# Patient Record
Sex: Female | Born: 2005 | Race: White | Hispanic: Yes | Marital: Single | Smoking: Never smoker
Health system: Southern US, Community
[De-identification: ages and names within clinical notes are randomized; demographics above are authoritative.]

---
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
## 2023-04-28 ENCOUNTER — Other Ambulatory Visit (LOCAL_COMMUNITY_HEALTH_CENTER): Payer: Self-pay

## 2023-04-28 DIAGNOSIS — Z201 Contact with and (suspected) exposure to tuberculosis: Secondary | ICD-10-CM

## 2023-04-28 LAB — HM HIV SCREENING LAB: HM HIV Screening: NEGATIVE
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
## 2023-05-02 LAB — QUANTIFERON-TB GOLD PLUS
QuantiFERON Mitogen Value: >10 [IU]/mL
QuantiFERON Nil Value: 0 [IU]/mL
QuantiFERON TB1 Ag Value: 0.03 [IU]/mL
QuantiFERON TB2 Ag Value: 0.05 [IU]/mL
QuantiFERON-TB Gold Plus: NEGATIVE

## 2023-05-05 ENCOUNTER — Ambulatory Visit (LOCAL_COMMUNITY_HEALTH_CENTER): Payer: Medicaid Other

## 2023-05-05 VITALS — Wt 209.0 lb

## 2023-05-05 DIAGNOSIS — Z111 Encounter for screening for respiratory tuberculosis: Secondary | ICD-10-CM

## 2023-05-05 NOTE — Progress Notes (Signed)
TC to patient today re: negative QFT results and pending HIV result. Completed EPI today even thought results are negative. Informed patient that we would possibly repeat QFT in 8 weeks depending on family members culture results.Richmond Campbell, RN   EPI-05/05/23 QFT negative 04/28/23 HIV pending 04/28/23

## 2023-08-17 ENCOUNTER — Other Ambulatory Visit: Payer: Medicaid Other

## 2023-08-17 DIAGNOSIS — Z111 Encounter for screening for respiratory tuberculosis: Secondary | ICD-10-CM
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
## 2023-08-20 ENCOUNTER — Telehealth: Payer: Self-pay

## 2023-08-20 DIAGNOSIS — Z201 Contact with and (suspected) exposure to tuberculosis: Secondary | ICD-10-CM

## 2023-08-20 DIAGNOSIS — R7612 Nonspecific reaction to cell mediated immunity measurement of gamma interferon antigen response without active tuberculosis: Secondary | ICD-10-CM

## 2023-08-20 LAB — QUANTIFERON-TB GOLD PLUS
QuantiFERON Mitogen Value: >10 [IU]/mL
QuantiFERON Nil Value: 0.1 [IU]/mL
QuantiFERON TB1 Ag Value: 2.65 [IU]/mL
QuantiFERON TB2 Ag Value: 2.46 [IU]/mL
QuantiFERON-TB Gold Plus: POSITIVE — AB

## 2023-08-20 NOTE — Progress Notes (Signed)
TC to patient. Informed positive QFT. Patient tearful and scared. Reports chest pressure and fatigue and she's unsure if it's stress and work or sickness. Works for [employer] and is super busy right now with the holidays.  Updated EPI from 05/05/23; no changes, no BCM, not sexually active, no OTC or Rx medications. Patient will try to go for CXR next week. Verbalized understanding to call TB RN if she has any questions once she absorbs this news. Stressed that we don't feel she has active TB and she does have to  isolate  like her grandfather was. CXR ordered Richmond Campbell, RN

## 2023-08-21 ENCOUNTER — Ambulatory Visit
Admission: RE | Admit: 2023-08-21 | Discharge: 2023-08-21 | Disposition: A | Discharge status: Home / Self Care | Payer: Medicaid Other | Source: Ambulatory Visit | Attending: Surgery | Admitting: Surgery

## 2023-08-21 DIAGNOSIS — Z201 Contact with and (suspected) exposure to tuberculosis: Secondary | ICD-10-CM | POA: Insufficient documentation

## 2023-08-21 DIAGNOSIS — R7612 Nonspecific reaction to cell mediated immunity measurement of gamma interferon antigen response without active tuberculosis: Secondary | ICD-10-CM | POA: Diagnosis present

## 2023-08-24 ENCOUNTER — Other Ambulatory Visit: Payer: Self-pay | Admitting: Surgery

## 2023-08-24 DIAGNOSIS — Z227 Latent tuberculosis: Secondary | ICD-10-CM | POA: Insufficient documentation

## 2023-08-24 DIAGNOSIS — R7612 Nonspecific reaction to cell mediated immunity measurement of gamma interferon antigen response without active tuberculosis: Secondary | ICD-10-CM

## 2023-08-24 NOTE — Progress Notes (Signed)
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   Diagnosis of LTBI and pertinent labs/info: +QFT: 08/17/2023 CXR: negative 08/21/23 EPI: 05/05/23 and updated on 08/24/23  HIV: neg 04/28/23  Therapy: Rifabutin 300mg  daily for 4 months Planned LTBI therapy start date: 08/28/2023  PCP: n/a   Tuberculosis treatment orders  All patients are to be monitored per [place] and county TB policies.   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 Treat for latent TB per the following:  Rifabutin 300mg  daily by mouth x 4 months per Dr. Wyvonnia Lora.   No monthly or baseline labs currently indicated. Only needs labs if concerning symptoms arise such as persistent rash, itching, nosebleeds, easy bruising, nausea, vomiting, dark colored urine, the patient starts new potentially hepatotoxic medications, or significant alcohol consumption is reported that would require monitoring.

## 2023-08-28 ENCOUNTER — Ambulatory Visit: Payer: Medicaid Other

## 2023-08-28 VITALS — Wt 215.0 lb

## 2023-08-28 DIAGNOSIS — Z227 Latent tuberculosis: Secondary | ICD-10-CM | POA: Diagnosis not present

## 2023-08-28 DIAGNOSIS — R7612 Nonspecific reaction to cell mediated immunity measurement of gamma interferon antigen response without active tuberculosis: Secondary | ICD-10-CM | POA: Insufficient documentation

## 2023-08-28 MED ORDER — RIFAMPIN 300 MG PO CAPS: 600.0000 mg | ORAL_CAPSULE | Freq: Every day | ORAL | Status: AC

## 2023-08-28 NOTE — Progress Notes (Signed)
Patient in clinic today to start LTBI tx. Originally patient was ordered Rifabutin x 4 months but [facility] received a shipment of Rifampin this week. Rifampin 300mg  #60 dispensed per Dr. Wyvonnia Lora standing order. TB RN will follow up with patient to schedule next appt.  Richmond Campbell, RN

## 2023-08-28 NOTE — Progress Notes (Signed)
Due to [facility] receiving a shipment of Rifampin 300mg , patient will be treated for LTBI per Dr. Irena Cords standing order.  Rifampin 300mg  #60; 2 capsules daily x 4 months.Richmond Campbell, RN

## 2023-09-25 ENCOUNTER — Ambulatory Visit: Payer: Medicaid Other

## 2023-09-25 VITALS — Wt 209.0 lb

## 2023-09-25 DIAGNOSIS — Z227 Latent tuberculosis: Secondary | ICD-10-CM

## 2023-09-25 MED ORDER — RIFAMPIN 300 MG PO CAPS: 600.0000 mg | ORAL_CAPSULE | Freq: Every day | ORAL | Status: AC

## 2023-09-25 NOTE — Progress Notes (Signed)
Patient in clinic for Rifampin #2 visit. No complaints and is doing well on antibiotic. Rifampin 300mg  #60 dispensed per Dr. Wyvonnia Lora SO Richmond Campbell, RN

## 2023-10-26 ENCOUNTER — Ambulatory Visit (LOCAL_COMMUNITY_HEALTH_CENTER): Payer: Medicaid Other

## 2023-10-26 VITALS — Wt 209.0 lb

## 2023-10-26 DIAGNOSIS — Z227 Latent tuberculosis: Secondary | ICD-10-CM

## 2023-10-26 MED ORDER — RIFAMPIN 300 MG PO CAPS: 600.0000 mg | ORAL_CAPSULE | Freq: Every day | ORAL | Status: AC

## 2023-10-26 NOTE — Progress Notes (Signed)
 Patient in clinic for #3 Rifampin/LTBI tx. Denies any concerns or problems with treatment thus far. Dispensed Rifampin 300mg  #60 per Wyvonnia Lora SO. Patient understands that she has one more month of meds after completing this bottle of Rifampin Richmond Campbell, RN

## 2023-11-26 ENCOUNTER — Telehealth: Payer: Self-pay

## 2023-11-26 NOTE — Telephone Encounter (Signed)
 Patient needs Rifampin # 4. Phone call to patient, appointment scheduled 11/30/2023 at 2:15 pm. Stressed importance of completing treatment as recommended, states understanding and agrees with plan. Lavinia Sharps, RN

## 2023-11-30 ENCOUNTER — Ambulatory Visit

## 2023-11-30 ENCOUNTER — Other Ambulatory Visit: Payer: Self-pay

## 2023-11-30 VITALS — Wt 201.0 lb

## 2023-11-30 DIAGNOSIS — Z227 Latent tuberculosis: Secondary | ICD-10-CM | POA: Diagnosis not present

## 2023-11-30 MED ORDER — RIFAMPIN 300 MG PO CAPS: 600.0000 mg | ORAL_CAPSULE | Freq: Every day | ORAL | Status: AC

## 2023-11-30 NOTE — Progress Notes (Signed)
 Patient has been taking Rifampin 600 mg daily for 3 months for LTBI treatment.  Patient is doing well on current medication regimen.   TB flowsheet complete.   Dispensed #4 and final month of Rifampin for LTBI tx. Dispensed #60, 300 mg capsules.   Explained to patient that will always be positive on PPD skin test and blood test for exposure to TB. If patient is asked by employer, school, or other institution to take a TB test, patient should supply proof of treatment completion and/or obtain a TB Screening at the health department or at patient's PCP. This information was also provided in writing in the patient's completion letter which was given to the patient today along with a TB treatment completion card.   All completion letters and completion card were sent for scanning into Epic, along with the patient's TB drug record ([id number]).  Patient was advised to contact [facility]/TB control phone for any concerning symptoms or questions.    Patient states understanding and agrees with plan. Lavinia Sharps, RN

## 2024-02-04 ENCOUNTER — Ambulatory Visit (INDEPENDENT_AMBULATORY_CARE_PROVIDER_SITE_OTHER)

## 2024-02-04 ENCOUNTER — Ambulatory Visit
Admission: RE | Admit: 2024-02-04 | Discharge: 2024-02-04 | Disposition: A | Discharge status: Home / Self Care | Payer: Self-pay | Source: Ambulatory Visit | Attending: Family Medicine | Admitting: Family Medicine

## 2024-02-04 VITALS — BP 130/87 | HR 84 | Temp 98.7°F | Resp 16

## 2024-02-04 DIAGNOSIS — M79674 Pain in right toe(s): Secondary | ICD-10-CM

## 2024-02-04 DIAGNOSIS — S92501A Displaced unspecified fracture of right lesser toe(s), initial encounter for closed fracture: Secondary | ICD-10-CM

## 2024-02-04 NOTE — Discharge Instructions (Addendum)
 You broke your toe.  If medication was prescribed, stop by the pharmacy to pick up your prescriptions.  For your  pain, Take 1000 mg Tylenol with ibuprofen  400-800 mg up to 3 times a day,  as needed for pain. Wear your hard soled shoe with activity.. Rest and elevate the affected painful area.  As pain recedes, begin normal activities slowly as tolerated.  Follow up with an orthopedic provider for further evaluation.

## 2024-02-04 NOTE — ED Triage Notes (Signed)
 Patient states that she was running in her livingroom and hit her left foot on the wall. Middle toe pain and bruising

## 2024-02-04 NOTE — ED Provider Notes (Signed)
 [facility]    CSN: [id number] Arrival date & time: 02/04/24  1024      History   Chief Complaint Chief Complaint  Patient presents with   Toe Pain    Entered by patient    HPI  HPI Hannah Cole is a 18 y.o. female.   Hannah Cole presents for middle toe  pain on the right after injuring her foot on the corner of the wall last night.  She was running and her toes struck the wall. Has pain radiating into her mid foot with walking.  Had some bruising and slight numbness around her 3rd toe. No previous foot injury.  Took Tylenol for pain which helped. She applied some ice too.     History reviewed. No pertinent past medical history.  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 No pertinent surgical history.  OB History   No obstetric history on file.      Home Medications    Prior to Admission medications   Medication Sig Start Date End Date Taking? Authorizing Provider  cetirizine (ZYRTEC) 10 MG tablet Take 10 mg by mouth daily.    [provider]  ibuprofen  (ADVIL ,MOTRIN ) 600 MG tablet Take 1 tablet (600 mg total) by mouth every 8 (eight) hours as needed. Patient not taking: Reported on 11/30/2023 08/23/17   Allegra Arch, MD    Family History History reviewed. No pertinent family history.  Social History Social History   Tobacco Use   Smoking status: Never    Passive exposure: Never   Smokeless tobacco: Never  Vaping Use   Vaping status: Never Used  Substance Use Topics   Alcohol use: No   Drug use: Never     Allergies   Patient has no known allergies.   Review of Systems Review of Systems: :negative unless otherwise stated in HPI.      Physical Exam Triage Vital Signs ED Triage Vitals  Encounter Vitals Group     BP 02/04/24 1051 130/87     Systolic BP Percentile --       Diastolic BP Percentile --      Pulse Rate 02/04/24 1051 84     Resp 02/04/24 1051 16     Temp 02/04/24 1051 98.7 F (37.1 C)     Temp Source 02/04/24 1051 Oral     SpO2 02/04/24 1051 98 %     Weight --      Height --      Head Circumference --      Peak Flow --      Pain Score 02/04/24 1050 6     Pain Loc --      Pain Education --      Exclude from Growth Chart --    No data found.  Updated Vital Signs BP 130/87 (BP Location: Right Arm)   Pulse 84   Temp 98.7 F (37.1 C) (Oral)   Resp 16   LMP 01/19/2024 (Exact Date)   SpO2 98%   Visual Acuity Right Eye Distance:   Left Eye Distance:   Bilateral Distance:    Right Eye Near:   Left Eye Near:    Bilateral Near:     Physical Exam GEN: well appearing female in no acute distress  CVS: well perfused  RESP: speaking in full sentences without pause, no respiratory distress  MSK:   Ankle/Foot, right: TTP noted at the 3rd tow and metatarsal. No visible erythema, +mild swelling and ecchymosis around proximal 3rd toe without bony deformity.  No evidence of tibiotalar deviation; Range of motion is full in all directions. Strength is 5/5 in all directions. No tenderness at the insertion/body/myotendinous junction of the Achilles tendon; No tenderness on posterior aspects of lateral and medial malleolus; Unremarkable squeeze; Talar dome non-tender; Unremarkable calcaneal squeeze; No plantar calcaneal tenderness; No tenderness over the navicular prominence or  over cuboid; No pain at base of 5th MT;  Able to walk 4 steps.     UC Treatments / Results  Labs (all labs ordered are listed, but only abnormal results are displayed) Labs Reviewed - No data to display  EKG   Radiology DG Toe 3rd Right Result Date: 02/04/2024 CLINICAL DATA:  Minimal toe injury.  Pain and bruising. EXAM: RIGHT THIRD TOE COMPARISON:  None Available. FINDINGS: There is a mildly comminuted and mildly displaced fracture of the right 3rd proximal  phalanx. This fracture demonstrates no definite intra-articular extension to the metatarsophalangeal or proximal interphalangeal joints. There is no dislocation or other acute osseous abnormality. Soft tissue swelling is present without evidence of foreign body or soft tissue emphysema. IMPRESSION: Mildly comminuted and mildly displaced fracture of the right 3rd proximal phalanx. Electronically Signed   By: Elmon Hagedorn M.D.   On: 02/04/2024 11:30     Procedures Procedures (including critical care time)  Medications Ordered in UC Medications - No data to display  Initial Impression / Assessment and Plan / UC Course  I have reviewed the triage vital signs and the nursing notes.  Pertinent labs & imaging results that were available during my care of the patient were reviewed by me and considered in my medical decision making (see chart for details).      Pt is a 18 y.o.  female with acute right 3rd toe injuring while running in the house and striking the wall last night. On exam, pt has tenderness with edema and bruising around her proximal 3rd toe concerning for possible fracture therefore obtain third toe plain films. Personally interpreted by me were remarkable for third proximal toe fracture. Radiologist report reviewed and additionally notes mildly comminuted and mildly displaced fracture of the third proximal phalanx with soft tissue swelling.  Plain films reviewed at the bedside with the patient.  Recommended hard soled shoe and she is agreeable.  Hard soled shoe applied prior to discharge.  Patient to gradually return to normal activities, as tolerated and continue ordinary activities within the limits permitted by pain.  Offered additional pain control due to acute fracture but she declined.  Continue Motrin  and/or Tylenol PRN.   Patient to follow up with orthopedic provider/podiatrist for fracture management.  Work note provided.  Return and ED precautions given. Understanding  voiced. Discussed MDM, treatment plan and plan for follow-up with patient who agrees with plan.   Final Clinical Impressions(s) / UC Diagnoses   Final diagnoses:  Pain of toe of right foot  Closed fracture of phalanx of right third toe, initial encounter     Discharge Instructions      You broke your toe.  If medication was prescribed, stop by the pharmacy to pick up your prescriptions.  For your  pain, Take 1000 mg Tylenol with ibuprofen  400-800 mg up to 3 times a day,  as needed for pain. Wear your hard soled shoe with activity.. Rest and elevate the affected painful area.  As pain recedes, begin normal activities slowly as tolerated.  Follow up with an orthopedic provider for further evaluation.      ED Prescriptions   None    PDMP not reviewed this encounter.   Mirella Gueye, DO 02/04/24 1749

## 2024-02-12 ENCOUNTER — Encounter: Payer: Self-pay | Admitting: Podiatry

## 2024-02-12 ENCOUNTER — Ambulatory Visit: Admitting: Podiatry

## 2024-02-12 VITALS — Wt 201.0 lb

## 2024-02-12 DIAGNOSIS — S92514A Nondisplaced fracture of proximal phalanx of right lesser toe(s), initial encounter for closed fracture: Secondary | ICD-10-CM

## 2024-02-12 NOTE — Progress Notes (Signed)
   Chief Complaint  Patient presents with   Toe Injury    Pt is here due to Fracture toe third toe on right foot happen on 6/5 states she stump toe into wall, as of now pain is not as bad as it was.     HPI: 18 y.o. female presenting today as a new patient for evaluation of toe fracture right foot.  Injured on 02/04/2024 when she caught her foot on the corner of a wall.  Went to the urgent care diagnosed with comminuted fracture of the proximal phalanx of the right third toe.  Currently WBAT in a surgical shoe.  No past medical history on file.  No past surgical history on file.  No Known Allergies   Physical Exam: General: The patient is alert and oriented x3 in no acute distress.  Dermatology: Skin is warm, dry and supple bilateral lower extremities.   Vascular: Palpable pedal pulses bilaterally. Capillary refill within normal limits.  No appreciable edema.  No erythema.  Neurological: Grossly intact via light touch  Musculoskeletal Exam: No pedal deformities noted  DG Toe 3rd Right 02/04/2024 IMPRESSION: Mildly comminuted and mildly displaced fracture of the right 3rd proximal phalanx.  Assessment/Plan of Care: 1.  Fracture third toe right foot proximal phalanx; initial encounter  -Patient evaluated.  X-rays reviewed -Continue WBAT surgical shoe -Patient works at [employer].  The toe is stable and she may return to work beginning 02/22/2024.  Mandatory steel toe shoes.  Note for work provided -When patient is not working recommend WBAT surgical shoe -RICE -OTC Motrin  PRN -Return to clinic 5 weeks follow-up x-ray     Dot Gazella, DPM [facility]  Dr. Dot Gazella, DPM    [address] [place], [place] [address]                Office [phone]  Fax [phone]

## 2024-02-23 ENCOUNTER — Other Ambulatory Visit: Payer: Self-pay

## 2024-03-18 ENCOUNTER — Ambulatory Visit (INDEPENDENT_AMBULATORY_CARE_PROVIDER_SITE_OTHER)

## 2024-03-18 ENCOUNTER — Encounter: Payer: Self-pay | Admitting: Podiatry

## 2024-03-18 ENCOUNTER — Ambulatory Visit (INDEPENDENT_AMBULATORY_CARE_PROVIDER_SITE_OTHER): Admitting: Podiatry

## 2024-03-18 VITALS — Wt 201.0 lb

## 2024-03-18 DIAGNOSIS — S92514A Nondisplaced fracture of proximal phalanx of right lesser toe(s), initial encounter for closed fracture: Secondary | ICD-10-CM

## 2024-03-18 NOTE — Progress Notes (Signed)
   Chief Complaint  Patient presents with   Toe Injury    Pt is here to f/u on right 3rd toe due to fracture, she states it feels a lot better, no longer wearing post op shoe.    HPI: 18 y.o. female presenting today for follow-up evaluation of toe fracture right foot.  Injured on 02/04/2024 when she caught her foot on the corner of a wall.  Went to the urgent care diagnosed with comminuted fracture of the proximal phalanx of the right third toe.    Since last visit the patient is doing very well.  She no longer has any pain or tenderness.  She is actually returning to exercising with no complaints  No past medical history on file.  No past surgical history on file.  No Known Allergies   Physical Exam: General: The patient is alert and oriented x3 in no acute distress.  Dermatology: Skin is warm, dry and supple bilateral lower extremities.   Vascular: Palpable pedal pulses bilaterally. Capillary refill within normal limits.  No appreciable edema.  No erythema.  Neurological: Grossly intact via light touch  Musculoskeletal Exam: No pedal deformities noted.  Toes in good rectus alignment.  There is no tenderness with palpation  DG Toe 3rd Right 02/04/2024 IMPRESSION: Mildly comminuted and mildly displaced fracture of the right 3rd proximal phalanx.  Radiographic exam RT foot 03/18/2024 Unchanged from prior x-rays.  Rectus alignment of the toe.  There continues to be comminuted impacted fracture of the proximal phalanx.  There appears to be some routine healing noted with dissipation of the acute fracture lines visually on x-ray.  Assessment/Plan of Care: 1.  Fracture third toe right foot proximal phalanx; subsequent encounter with routine healing  -Patient evaluated.  X-rays reviewed - Currently WBAT in tennis shoes and resuming full activity.  Okay to continue as long as this toe continues to be asymptomatic -Currently working full activity with no restrictions.  Continue -Return  to clinic PRN     Thresa EMERSON Sar, DPM [facility]  Dr. Thresa EMERSON Sar, DPM    [address] [place], [place] [address]                Office [phone]  Fax [phone]
